# Patient Record
Sex: Male | Born: 1945 | Race: White | Hispanic: No | Marital: Married | Smoking: Former smoker
Health system: Southern US, Community
[De-identification: ages and names within clinical notes are randomized; demographics above are authoritative.]

---

## 2005-05-10 ENCOUNTER — Encounter: Admission: RE | Admit: 2005-05-10 | Discharge: 2005-05-10 | Payer: Self-pay | Admitting: Neurosurgery

## 2008-10-16 ENCOUNTER — Emergency Department (HOSPITAL_COMMUNITY): Admission: EM | Admit: 2008-10-16 | Discharge: 2008-10-16 | Payer: Self-pay | Admitting: Emergency Medicine

## 2010-09-23 ENCOUNTER — Encounter: Payer: Self-pay | Admitting: Unknown Physician Specialty

## 2010-12-18 LAB — URINALYSIS, ROUTINE W REFLEX MICROSCOPIC
Ketones, ur: NEGATIVE mg/dL
Protein, ur: NEGATIVE mg/dL
Urobilinogen, UA: 0.2 mg/dL (ref 0.0–1.0)

## 2010-12-18 LAB — POCT I-STAT, CHEM 8
Calcium, Ion: 1.13 mmol/L (ref 1.12–1.32)
Glucose, Bld: 236 mg/dL — ABNORMAL HIGH (ref 70–99)
HCT: 46 % (ref 39.0–52.0)
TCO2: 31 mmol/L (ref 0–100)

## 2010-12-18 LAB — CBC
MCHC: 35 g/dL (ref 30.0–36.0)
MCV: 91.3 fL (ref 78.0–100.0)
Platelets: 325 10*3/uL (ref 150–400)
RDW: 12 % (ref 11.5–15.5)
WBC: 11.4 10*3/uL — ABNORMAL HIGH (ref 4.0–10.5)

## 2010-12-18 LAB — DIFFERENTIAL
Basophils Absolute: 0.1 10*3/uL (ref 0.0–0.1)
Basophils Relative: 1 % (ref 0–1)
Eosinophils Absolute: 0.3 10*3/uL (ref 0.0–0.7)
Neutrophils Relative %: 63 % (ref 43–77)

## 2010-12-18 LAB — GLUCOSE, CAPILLARY: Glucose-Capillary: 264 mg/dL — ABNORMAL HIGH (ref 70–99)

## 2011-03-22 ENCOUNTER — Emergency Department (HOSPITAL_COMMUNITY): Payer: Medicare HMO

## 2011-03-22 ENCOUNTER — Inpatient Hospital Stay (HOSPITAL_COMMUNITY)
Admission: EM | Admit: 2011-03-22 | Discharge: 2011-03-24 | DRG: 872 | Disposition: A | Discharge status: Home / Self Care | Payer: Medicare HMO | Attending: Internal Medicine | Admitting: Internal Medicine

## 2011-03-22 ENCOUNTER — Inpatient Hospital Stay (HOSPITAL_COMMUNITY): Payer: Medicare HMO

## 2011-03-22 DIAGNOSIS — S3730XA Unspecified injury of urethra, initial encounter: Secondary | ICD-10-CM | POA: Diagnosis not present

## 2011-03-22 DIAGNOSIS — N179 Acute kidney failure, unspecified: Secondary | ICD-10-CM | POA: Diagnosis not present

## 2011-03-22 DIAGNOSIS — A419 Sepsis, unspecified organism: Principal | ICD-10-CM | POA: Diagnosis present

## 2011-03-22 DIAGNOSIS — E785 Hyperlipidemia, unspecified: Secondary | ICD-10-CM | POA: Diagnosis present

## 2011-03-22 DIAGNOSIS — L02419 Cutaneous abscess of limb, unspecified: Secondary | ICD-10-CM | POA: Diagnosis present

## 2011-03-22 DIAGNOSIS — L03119 Cellulitis of unspecified part of limb: Secondary | ICD-10-CM | POA: Diagnosis present

## 2011-03-22 DIAGNOSIS — I129 Hypertensive chronic kidney disease with stage 1 through stage 4 chronic kidney disease, or unspecified chronic kidney disease: Secondary | ICD-10-CM | POA: Diagnosis present

## 2011-03-22 DIAGNOSIS — R310 Gross hematuria: Secondary | ICD-10-CM | POA: Diagnosis not present

## 2011-03-22 DIAGNOSIS — D72829 Elevated white blood cell count, unspecified: Secondary | ICD-10-CM | POA: Diagnosis present

## 2011-03-22 DIAGNOSIS — Z794 Long term (current) use of insulin: Secondary | ICD-10-CM

## 2011-03-22 DIAGNOSIS — G4700 Insomnia, unspecified: Secondary | ICD-10-CM | POA: Diagnosis present

## 2011-03-22 DIAGNOSIS — S3720XA Unspecified injury of bladder, initial encounter: Secondary | ICD-10-CM | POA: Diagnosis not present

## 2011-03-22 DIAGNOSIS — X58XXXA Exposure to other specified factors, initial encounter: Secondary | ICD-10-CM | POA: Diagnosis not present

## 2011-03-22 DIAGNOSIS — R6510 Systemic inflammatory response syndrome (SIRS) of non-infectious origin without acute organ dysfunction: Secondary | ICD-10-CM | POA: Diagnosis present

## 2011-03-22 DIAGNOSIS — E119 Type 2 diabetes mellitus without complications: Secondary | ICD-10-CM | POA: Diagnosis present

## 2011-03-22 DIAGNOSIS — N189 Chronic kidney disease, unspecified: Secondary | ICD-10-CM | POA: Diagnosis present

## 2011-03-22 LAB — HEMOGLOBIN A1C: Mean Plasma Glucose: 200 mg/dL — ABNORMAL HIGH (ref <117)

## 2011-03-22 LAB — DIFFERENTIAL
Basophils Absolute: 0 K/uL (ref 0.0–0.1)
Basophils Relative: 0 % (ref 0–1)
Eosinophils Absolute: 0.1 10*3/uL (ref 0.0–0.7)
Eosinophils Relative: 1 % (ref 0–5)
Lymphocytes Relative: 16 % (ref 12–46)
Lymphs Abs: 2 K/uL (ref 0.7–4.0)
Monocytes Absolute: 1.7 K/uL — ABNORMAL HIGH (ref 0.1–1.0)
Monocytes Relative: 14 % — ABNORMAL HIGH (ref 3–12)
Neutro Abs: 8.5 K/uL — ABNORMAL HIGH (ref 1.7–7.7)
Neutrophils Relative %: 70 % (ref 43–77)

## 2011-03-22 LAB — GLUCOSE, CAPILLARY
Glucose-Capillary: 110 mg/dL — ABNORMAL HIGH (ref 70–99)
Glucose-Capillary: 323 mg/dL — ABNORMAL HIGH (ref 70–99)

## 2011-03-22 LAB — URINALYSIS, ROUTINE W REFLEX MICROSCOPIC
Bilirubin Urine: NEGATIVE
Glucose, UA: NEGATIVE mg/dL
Hgb urine dipstick: NEGATIVE
Ketones, ur: NEGATIVE mg/dL
Leukocytes, UA: NEGATIVE
Nitrite: NEGATIVE
Protein, ur: NEGATIVE mg/dL
Specific Gravity, Urine: 1.019 (ref 1.005–1.030)
Urobilinogen, UA: 0.2 mg/dL (ref 0.0–1.0)
pH: 5 (ref 5.0–8.0)

## 2011-03-22 LAB — CK TOTAL AND CKMB (NOT AT ARMC)
CK, MB: 1.7 ng/mL (ref 0.3–4.0)
Relative Index: 0.6 (ref 0.0–2.5)
Total CK: 301 U/L — ABNORMAL HIGH (ref 7–232)

## 2011-03-22 LAB — COMPREHENSIVE METABOLIC PANEL
ALT: 7 U/L (ref 0–53)
AST: 12 U/L (ref 0–37)
Albumin: 3.5 g/dL (ref 3.5–5.2)
CO2: 29 mEq/L (ref 19–32)
Calcium: 9.1 mg/dL (ref 8.4–10.5)
Creatinine, Ser: 2.32 mg/dL — ABNORMAL HIGH (ref 0.50–1.35)
GFR calc non Af Amer: 28 mL/min — ABNORMAL LOW (ref >60)
Sodium: 138 mEq/L (ref 135–145)
Total Protein: 7.4 g/dL (ref 6.0–8.3)

## 2011-03-22 LAB — CBC
HCT: 39.2 % (ref 39.0–52.0)
Hemoglobin: 13.6 g/dL (ref 13.0–17.0)
MCH: 32.5 pg (ref 26.0–34.0)
MCHC: 34.7 g/dL (ref 30.0–36.0)
MCV: 93.6 fL (ref 78.0–100.0)
Platelets: 172 10*3/uL (ref 150–400)
RBC: 4.19 MIL/uL — ABNORMAL LOW (ref 4.22–5.81)
RDW: 13.5 % (ref 11.5–15.5)
WBC: 12.2 K/uL — ABNORMAL HIGH (ref 4.0–10.5)

## 2011-03-22 LAB — BASIC METABOLIC PANEL
BUN: 35 mg/dL — ABNORMAL HIGH (ref 6–23)
Chloride: 100 mEq/L (ref 96–112)
GFR calc Af Amer: 35 mL/min — ABNORMAL LOW (ref >60)
GFR calc non Af Amer: 29 mL/min — ABNORMAL LOW (ref >60)
Potassium: 4 mEq/L (ref 3.5–5.1)

## 2011-03-22 LAB — CLOSTRIDIUM DIFFICILE BY PCR: Toxigenic C. Difficile by PCR: NEGATIVE

## 2011-03-22 LAB — COMPREHENSIVE METABOLIC PANEL WITH GFR
Alkaline Phosphatase: 64 U/L (ref 39–117)
BUN: 34 mg/dL — ABNORMAL HIGH (ref 6–23)
Chloride: 98 meq/L (ref 96–112)
GFR calc Af Amer: 34 mL/min — ABNORMAL LOW (ref >60)
Glucose, Bld: 113 mg/dL — ABNORMAL HIGH (ref 70–99)
Potassium: 4 meq/L (ref 3.5–5.1)
Total Bilirubin: 1 mg/dL (ref 0.3–1.2)

## 2011-03-22 LAB — LIPID PANEL
HDL: 40 mg/dL (ref >39)
LDL Cholesterol: 74 mg/dL (ref 0–99)

## 2011-03-22 LAB — TROPONIN I: Troponin I: <0.3 ng/mL (ref <0.30)

## 2011-03-22 LAB — TSH: TSH: 1.155 u[IU]/mL (ref 0.350–4.500)

## 2011-03-22 LAB — PROCALCITONIN: Procalcitonin: <0.1 ng/mL

## 2011-03-22 LAB — LACTIC ACID, PLASMA: Lactic Acid, Venous: 2.1 mmol/L (ref 0.5–2.2)

## 2011-03-23 LAB — GLUCOSE, CAPILLARY
Glucose-Capillary: 289 mg/dL — ABNORMAL HIGH (ref 70–99)
Glucose-Capillary: 299 mg/dL — ABNORMAL HIGH (ref 70–99)
Glucose-Capillary: 318 mg/dL — ABNORMAL HIGH (ref 70–99)
Glucose-Capillary: 340 mg/dL — ABNORMAL HIGH (ref 70–99)

## 2011-03-23 LAB — BASIC METABOLIC PANEL
BUN: 36 mg/dL — ABNORMAL HIGH (ref 6–23)
CO2: 22 mEq/L (ref 19–32)
Chloride: 101 mEq/L (ref 96–112)
Glucose, Bld: 291 mg/dL — ABNORMAL HIGH (ref 70–99)
Potassium: 4 mEq/L (ref 3.5–5.1)
Sodium: 136 mEq/L (ref 135–145)

## 2011-03-23 LAB — URINE CULTURE
Colony Count: NO GROWTH
Culture  Setup Time: 201207201016
Culture: NO GROWTH

## 2011-03-23 LAB — FECAL LACTOFERRIN, QUANT: Fecal Lactoferrin: POSITIVE

## 2011-03-23 IMAGING — US US RENAL
1 series · 14 of 17 positions shown · non-contrast
Comparison: None.

CLINICAL DATA: Hypertension and renal insufficiency.

RENAL/URINARY TRACT ULTRASOUND COMPLETE

[Series 1: us renal · 0.29mm/px · 14 of 17 slices shown]
[im 1/17]
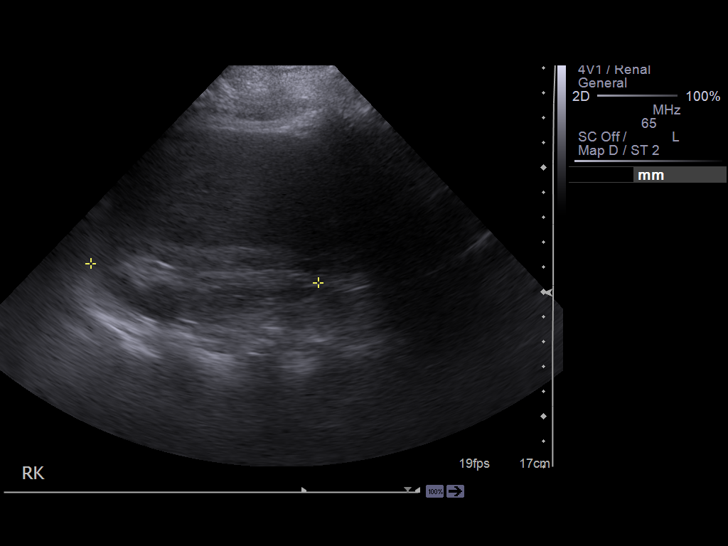
[im 2/17]
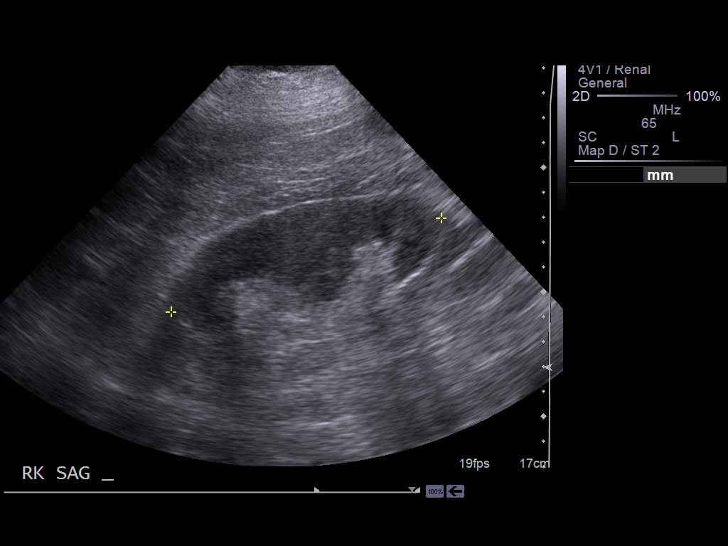
[im 4/17]
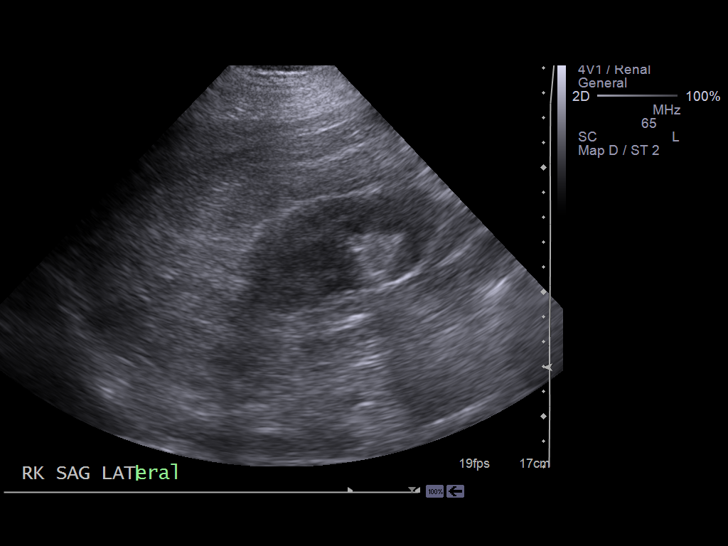
[im 5/17]
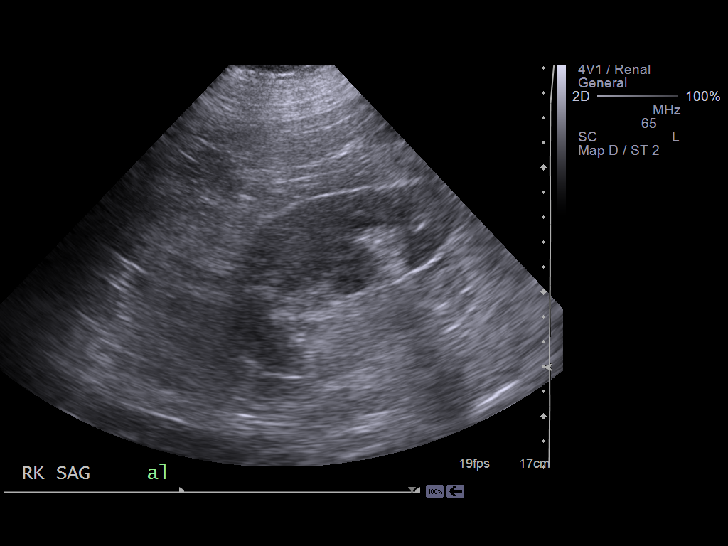
[im 6/17]
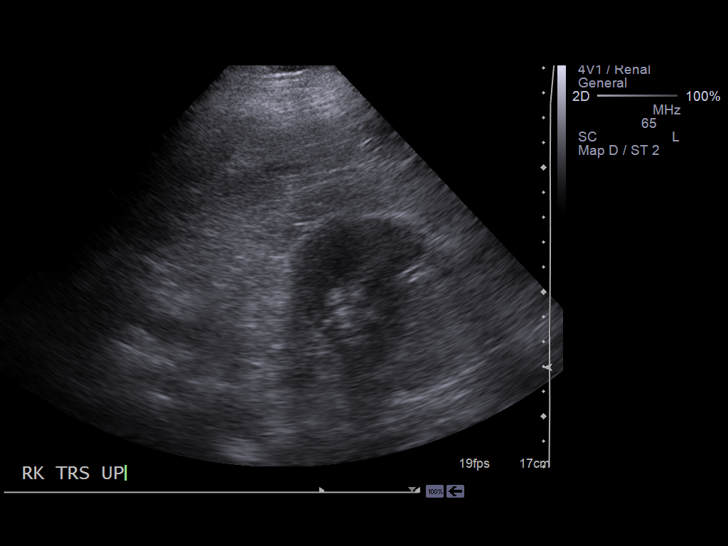
[im 7/17]
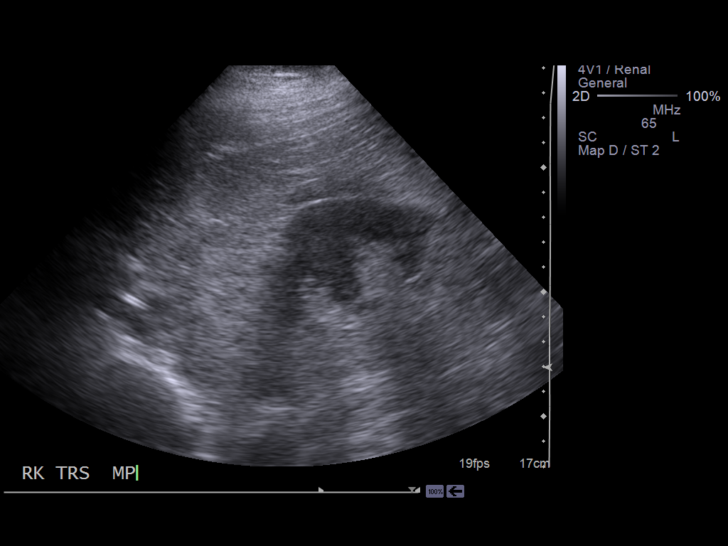
[im 8/17]
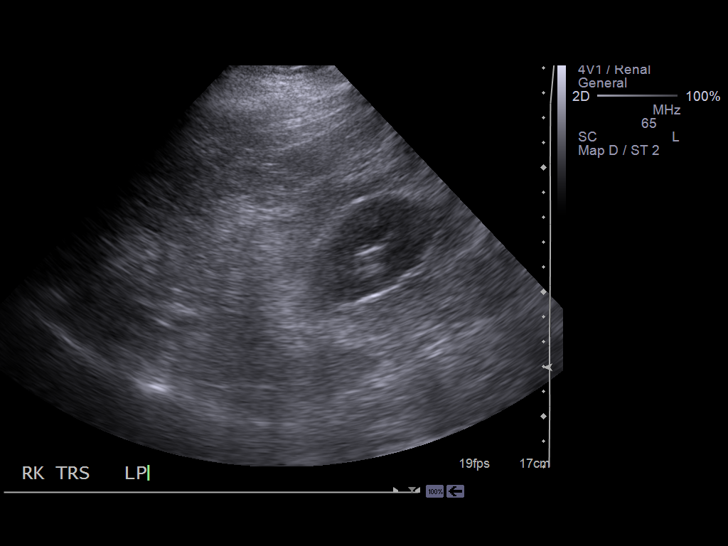
[im 10/17]
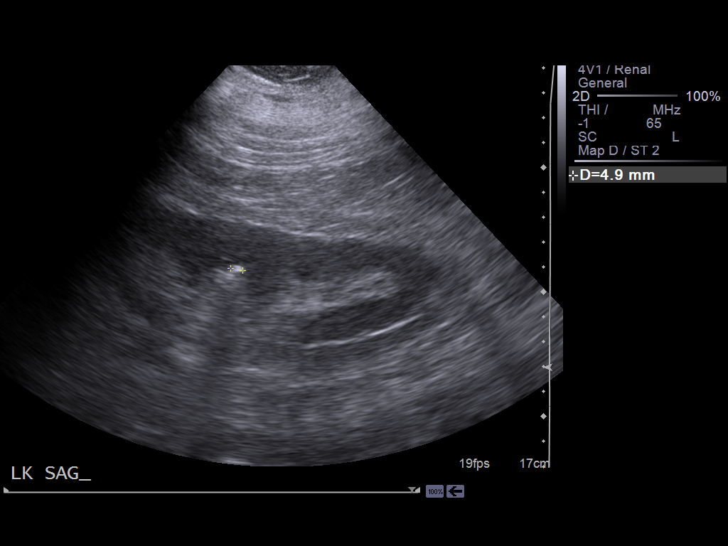
[im 11/17]
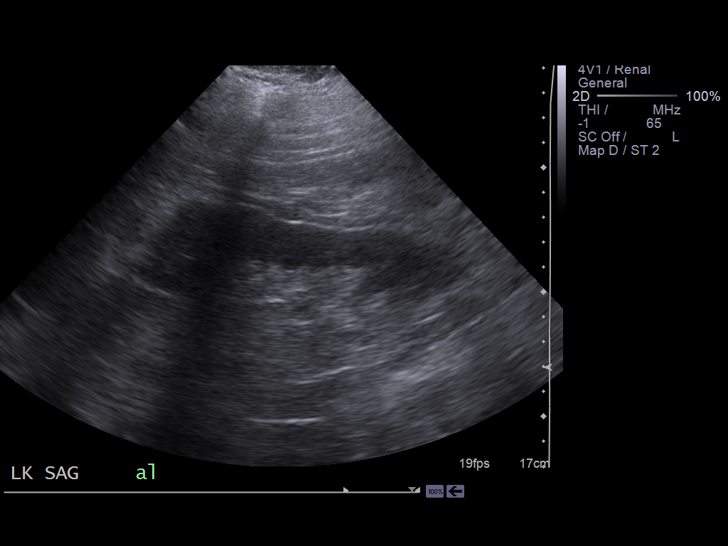
[im 12/17]
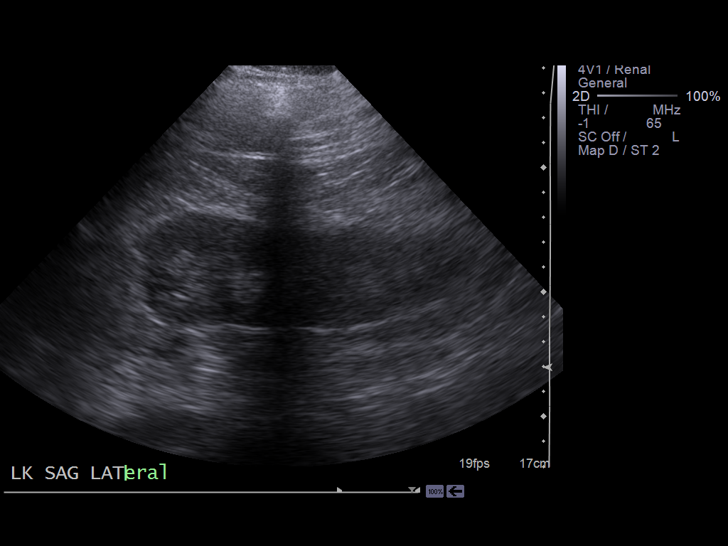
[im 13/17]
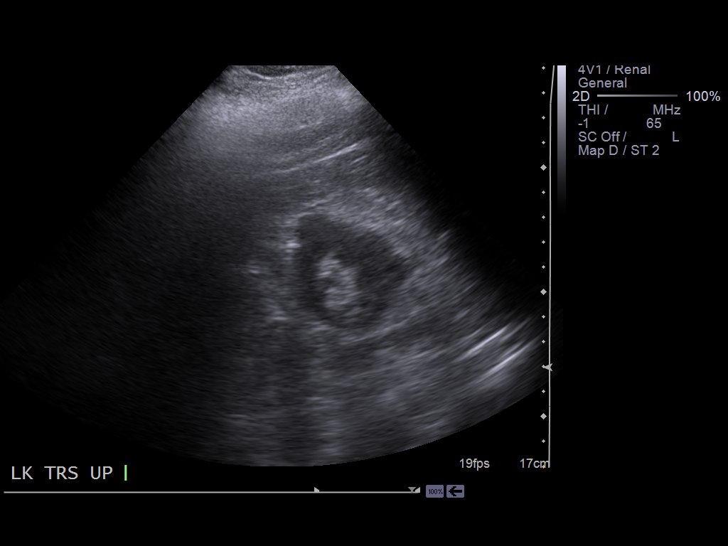
[im 14/17]
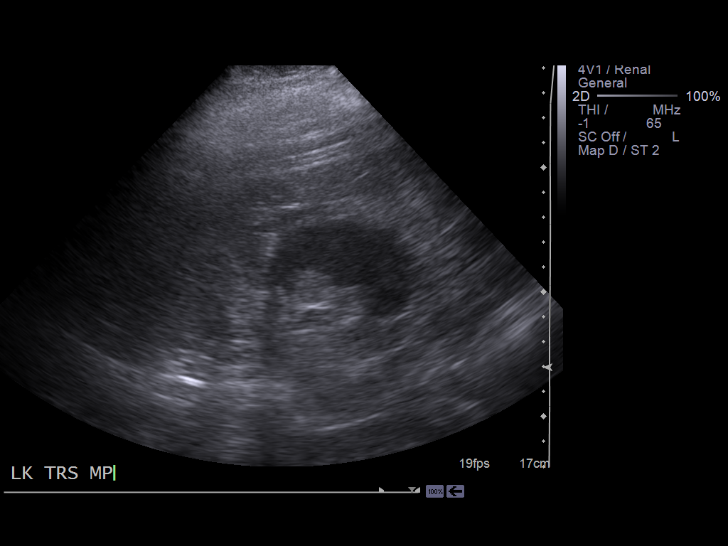
[im 16/17]
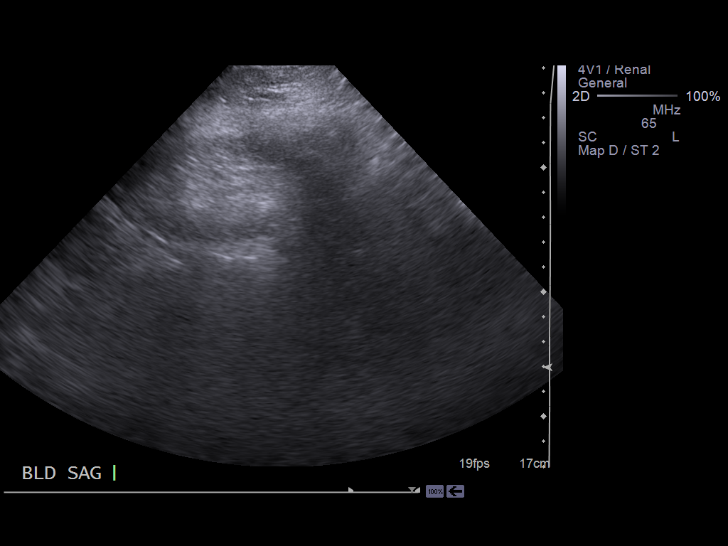
[im 17/17]
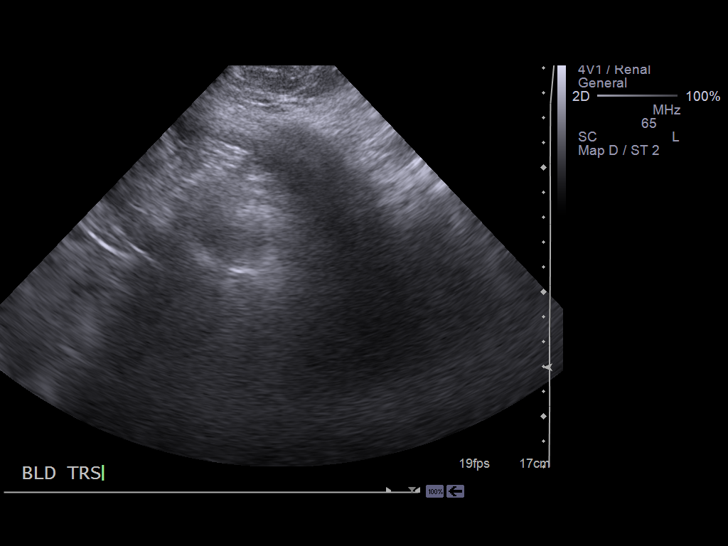

[14 of 17 positions shown; findings below may reference images not displayed]

FINDINGS: Right Kidney:  The right kidney measures approximately 11.5 cm in
length.  The kidney is unremarkable in sonographic appearance
without evidence of obstruction. There likely is partial
duplication of the collecting system.  No focal mass or shadowing
calculi identified.

Left Kidney:  The left kidney measures approximately 13.5 cm.
There is a focal shadowing calculus in the lower pole measuring
approximately 5 mm and not causing obstruction.  No evidence of
hydronephrosis, focal mass or significant atrophy.

Bladder:  The bladder is decompressed.
IMPRESSION: No evidence of renal obstruction or significant atrophy.
Nonobstructing shadowing calculus in the lower pole of the left
kidney measures approximately 5 mm by ultrasound.

## 2011-03-24 LAB — URINALYSIS, ROUTINE W REFLEX MICROSCOPIC
Nitrite: NEGATIVE
Protein, ur: 100 mg/dL — AB
Specific Gravity, Urine: 1.026 (ref 1.005–1.030)
Urobilinogen, UA: 0.2 mg/dL (ref 0.0–1.0)

## 2011-03-24 LAB — BASIC METABOLIC PANEL
BUN: 26 mg/dL — ABNORMAL HIGH (ref 6–23)
Chloride: 103 mEq/L (ref 96–112)
GFR calc Af Amer: 51 mL/min — ABNORMAL LOW (ref >60)
GFR calc non Af Amer: 42 mL/min — ABNORMAL LOW (ref >60)
Potassium: 4 mEq/L (ref 3.5–5.1)
Sodium: 138 mEq/L (ref 135–145)

## 2011-03-24 LAB — CBC
HCT: 32.9 % — ABNORMAL LOW (ref 39.0–52.0)
Hemoglobin: 10.9 g/dL — ABNORMAL LOW (ref 13.0–17.0)
MCHC: 33.1 g/dL (ref 30.0–36.0)
RDW: 13.5 % (ref 11.5–15.5)
WBC: 7.9 10*3/uL (ref 4.0–10.5)

## 2011-03-24 LAB — URINE MICROSCOPIC-ADD ON

## 2011-03-25 LAB — GIARDIA/CRYPTOSPORIDIUM SCREEN(EIA): Giardia Screen - EIA: NEGATIVE

## 2011-03-25 LAB — URINE CULTURE
Colony Count: NO GROWTH
Culture  Setup Time: 201207221341
Special Requests: NEGATIVE

## 2011-03-26 LAB — STOOL CULTURE

## 2011-03-27 NOTE — Consult Note (Signed)
NAMEIDAN, PRIME NO.:  [id number]  MEDICAL RECORD NO.:  [id number]  LOCATION:                                 FACILITY:  PHYSICIAN:  Valetta Fuller, MD    DATE OF BIRTH:  1945-11-30  DATE OF CONSULTATION:  03/22/2011 DATE OF DISCHARGE:                                CONSULTATION   REASON FOR CONSULTATION:  Gross hematuria, questionable ongoing urinary retention, bleeding per penis, and possible malpositioned Foley catheter.  HISTORY OF PRESENT ILLNESS:  Derrick Carpenter is 65 years of age.  He was admitted primarily with confusion as well as a right leg swelling and pain.  The patient went to his primary care doctor's office and had been noted to have questionable infection in his leg.  The patient subsequently was noted by his family to be markedly confused and febrile.  As part of his official assessment and stabilization, a Foley catheter was placed.  During that timeframe, the patient's confusion resulted in him pulling on the catheter.  He began experiencing substantial blood per urethra and blood in his urine.  The catheter was not able to be irrigated and we were consulted.  When we saw the patient, the Foley catheter was clearly not within the bladder and it appeared that the Foley balloon had been yanked into the prostatic urethra and the catheter was not draining.  That Foley catheter was removed with substantial blood per urethra.  We were able to replace that with a 20-French Foley and 1500 mL of tea-colored urine was obtained.  Irrigation was performed and there did not appear to be any evidence of ongoing bleeding.  At a baseline, the patient does appear to have at least moderate obstruction.  He denies any other urologic history and has not been under the care of a urologist and has not been taking any medication for BPH or voiding dysfunction.  Past medical history is notable for hypertension, diabetes mellitus, gout, and  hyperlipidemia.  The patient's admission medications include allopurinol, amitriptyline, cephalexin, Crestor, Cymbalta, Lantus insulin, promethazine, quinapril, trazodone.  He has no drug allergies.  SOCIAL HISTORY:  Negative for alcohol use.  Review of systems is positive for some leg swelling and discomfort.  Again, he has at least moderate baseline obstructive voiding symptoms.  Prior to our evaluation with him, he was complaining of extreme lower abdominal pressure and severe urge to urinate.  PHYSICAL EXAMINATION:  GENERAL:  The patient is a well-developed, well- nourished male in moderate to severe distress prior to proper Foley catheter placement. VITAL SIGNS:  The patient was afebrile with a blood pressure 135/73 and a pulse of 90. NECK:  Showed no obvious JVD. ABDOMEN:  Notable for lower abdominal distention, significant suprapubic tenderness. GU:  Penile exam showed a malpositioned Foley catheter with blood per meatus.  Unremarkable scrotum and adnexal structures.  PROCEDURE:  Again, a 20-French Foley catheter placed with irrigation.  ASSESSMENT:  Gross hematuria due to urethral trauma.  His Foley catheter should be left indwelling for an additional 48 hours and then removed if no longer clinically indicated.  Culture should be considered prior to discharge with appropriate treatment.  The patient probably should follow up with Urology as an outpatient given his baseline voiding symptoms.     Valetta Fuller, MD     DSG/MEDQ  D:  03/24/2011  T:  03/25/2011  Job:  [id number]  Electronically Signed by Barron Alvine M.D. on 03/27/2011 08:43:39 AM

## 2011-03-28 LAB — CULTURE, BLOOD (ROUTINE X 2)
Culture  Setup Time: 201207200941
Culture  Setup Time: 201207200941
Culture: NO GROWTH
Culture: NO GROWTH

## 2011-04-02 NOTE — Discharge Summary (Signed)
Derrick Carpenter, Derrick Carpenter                ACCOUNT NO.:  [id number]  MEDICAL RECORD NO.:  [id number]  LOCATION:  4702                         FACILITY:  [facility]  PHYSICIAN:  Mauro Kaufmann, MD         DATE OF BIRTH:  October 17, 1945  DATE OF ADMISSION:  03/22/2011 DATE OF DISCHARGE:  03/24/2011                              DISCHARGE SUMMARY   ADMISSION DIAGNOSES: 1. Altered mental status due to the early sepsis or systemic     inflammatory response syndrome. 2. Right leg cellulitis and diabetic foot. 3. Leukocytosis. 4. Diabetes mellitus. 5. Chronic kidney disease.  DISCHARGE DIAGNOSES: 1. Altered mental status, resolved, secondary to cellulitis. 2. Cellulitis, resolved. 3. Traumatic hematuria, resolved. 4. Diabetes mellitus. 5. Acute-on-chronic kidney disease, improved after stopping the Lasix     and giving intravenous fluids.  TESTS PERFORMED IN THE HOSPITAL STAY:  The patient had a chest x-ray on March 22, 2011, which showed cardiomegaly and no acute cardiopulmonary process. CT head without contrast on March 22, 2011, showed normal noncontrast CT of the brain for age.  Foot x-ray on March 22, 2011, showed stable and normal bone mineralization in the cuneiforms and metatarsals in 2010, query osteomyelitis versus erosive arthropathy.  No definite acute osseous abnormality in the right foot.  Renal ultrasound showed no evidence of renal obstruction, nonobstructing shadowing calculus in the lower pole of left kidney measuring about 5 mm by ultrasound.  CONSULTS OBTAINED IN THE HOSPITAL:  Urology consult for the traumatic hematuria.  BRIEF HISTORY AND PHYSICAL:  This is a 65 year old Caucasian gentleman who has been having pain and swelling and redness of the right foot four days ago, went to the [facility] where he was given IM injection and prescription for Phenergan and Keflex.  The patient was discharged home but came back because he was not feeling better.  Yesterday,  the patient was found to have intermittent confusion which got worse in 12 hours.  The patient started talking out of his head and was not making any sense, so the patient was brought to the hospital and the patient was found to have cellulitis and started on IV antibiotics with vancomycin and Zosyn.  BRIEF HOSPITAL COURSE: 1. Cellulitis.  The patient had two sets of blood cultures done on     March 22, 2011, which have been negative so far.  He had normal     procalcitonin and lactic acid.  The patient improved with     vancomycin and Zosyn.  His cellulitis has almost resolved after the     antibiotics.  At this time, the patient will be sent home on p.o.     Bactrim for seven more days to complete 10 days course of     antibiotics.  The patient's white count was elevated on the day of     admission to 12.2 and it has gone down to 7.9. 2. Hematuria.  The patient had a Foley catheter placed, and he pulled     out Foley catheter when he was confused and agitated.  After that,     he started having frank hematuria, so Urology consultation was  obtained.  The patient's Foley catheter was replaced, and Urology     has been following the patient in the hospital.  At this time, the     patient's Foley has been removed and the patient has voided well.     The patient will follow up with the Urology as outpatient.  Also,     we found that the patient on renal ultrasound has 5-mm     nonobstructing kidney stone, for which also Dr. Isabel Caprice will follow     the patient as outpatient. 3. Chronic kidney disease.  The patient when came to the hospital was     found to have creatinine of 2.27.  After we noted that the patient     was on furosemide, we stopped the furosemide and his creatinine     also has come down to 1.6.  I am going to stop the Lasix at this     time.  The patient will be sent home on Bactrim.  Bactrim can     elevate the creatinine, so he needs to have a repeat BMET done in 2      weeks' time at the primary care's office. 4. Diabetes mellitus.  The patient's blood sugars are fairly well     controlled.  At one time, the blood sugar went up more than 250.     The patient is currently on NPH 60 units b.i.d.  He should follow     up with primary care physician to adjust the medications for his     diabetes. 5. Altered mental status.  As above, the patient has altered mental     status because of the cellulitis.  I am going to hold the Ambien at     this time and continue the patient on trazodone for his insomnia.  MEDICATIONS ON DISCHARGE: 1. Bactrim DS 1 tablet p.o. b.i.d. 2. Allopurinol 300 mg 1 tablet p.o. daily. 3. Humulin NPH 60 units subcu twice a day. 4. Promethazine 25 mg 1 tablet p.o. four times a day as needed. 5. Quinapril 40 mg 1 tablet p.o. daily. 6. Trazodone 150 mg p.o. daily at bedtime.  Stop taking furosemide.  Follow up with Dr. Isabel Caprice as outpatient and also follow up with primary care physician, Dr. Tomasa Blase as outpatient.     Mauro Kaufmann, MD     GL/MEDQ  D:  03/24/2011  T:  03/25/2011  Job:  [id number]  cc:   Foye Deer, MD Valetta Fuller, MD  Electronically Signed by Mauro Kaufmann  on 04/02/2011 11:49:23 AM

## 2011-04-20 NOTE — H&P (Signed)
NAMELOUKAS, ANTONSON NO.:  [id number]  MEDICAL RECORD NO.:  [id number]  LOCATION:  [facility]                         FACILITY:  [facility]  PHYSICIAN:  Homero Fellers, MD   DATE OF BIRTH:  1946-02-23  DATE OF ADMISSION:  03/22/2011 DATE OF DISCHARGE:                             HISTORY & PHYSICAL   PRIMARY CARE PHYSICIAN:  Unassigned.  CHIEF COMPLAINT:  Confusion and right leg swelling and pain.  HISTORY OF PRESENT ILLNESS:  This is a 65 year old Caucasian gentleman who has been having pain, swelling, and redness of his right leg which started a few days ago, prompted him to go to [facility] where he was given some IM injection and given a prescription for Phenergan and Keflex.  The patient was discharged home but came back because he was not feeling better.  Yesterday, family noticed intermittent confusion which got worse in the past 12 hours when the patient started talking out of his head and was not making sense altogether.  The patient also has been having fever, and in the emergency room, his temperature was 100.6.  The patient has not had any chest pain or vomiting even though he has been nauseous.  There is no abdominal pain, cough, shortness of breath, palpitation, urinary symptoms, or back pain.  There is no history of trauma to his right leg.  There is no neck stiffness or headache.  According to the family, his confusion seemed to get worse yesterday at [facility].  There is no reported itching or rash.  PAST MEDICAL HISTORY: 1. High blood pressure. 2. Diabetes mellitus. 3. Gout. 4. Hyperlipidemia.  MEDICATIONS:  Allopurinol, amitriptyline, cephalexin, Crestor, Cymbalta, Lasix, Lantus, potassium, promethazine, quinapril, trazodone, and Zolpidem.  ALLERGIES:  None.  SOCIAL HISTORY:  No smoking, alcohol, or drugs.  FAMILY HISTORY:  Noncontributory.  REVIEW OF SYSTEMS:  Ten-point review of systems is negative except  as above.  PHYSICAL EXAMINATION:  VITAL SIGNS:  Blood pressure is 129/67, pulse 91- 106, respirations 20, temperature 100.6. GENERAL:  The patient is confused, noncooperative, trying to climb out of bed, appeared to be in respiratory distress. HEENT:  Pallor.  Mouth is moist. NECK:  Supple.  No JVD, adenopathy, or thyromegaly. LUNGS:  Clear bilaterally to auscultation.  No wheezing or crackles. HEART:  S1 and S2 regular rate and rhythm.  No murmurs, rubs, or gallops. ABDOMEN:  Full, soft, nontender.  Bowel sounds present.  No masses. EXTREMITIES:  He has mild swelling of the right foot and ankle area with redness and streaking to the distal right leg.  There is no obvious wound, breakdown, or draining ulcers. LYMPHATICS:  Normal lymph glands with no swelling. NEUROLOGIC:  Apart from confusion, no focal deficits identified.  The patient is able to tell me his name but appeared disoriented to time and partly to place. PSYCHIATRIC:  Normal affect.  The patient is agitated.  LABORATORY DATA:  White count is 12.2, no left shift, hemoglobin 13.6, platelet count 172.  Glucose 113, potassium 4, BUN 34, creatinine 2.32 which is not his baseline, albumin 3.5. Chest x-ray, cardiomegaly with no acute cardiopulmonary abnormalities. Head CT showed no acute findings. Lactic acid 2.1.  Procalcitonin 0.1.  ASSESSMENT:  This is a 65 year old man admitted with: 1. Altered mental status which is probably likely secondary to early     sepsis versus systemic inflammatory response syndrome. 2. Right leg cellulitis and diabetic foot. 3. Leukocytosis, likely secondary to above. 4. Diabetes mellitus which appeared compensated. 5. Chronic kidney disease.  PLAN:  Admit to telemetry, use IV Zosyn and vancomycin.  Send blood cultures.  Send urinalysis and urine culture.  Keep n.p.o. until fully awake.  Put on fingerstick q.6 h plus low-dose Lantus.  Home medications should be optimized.  Use Ativan and  haloperidol as needed for agitation.  Overall condition is guarded.     Homero Fellers, MD     FA/MEDQ  D:  03/22/2011  T:  03/22/2011  Job:  [id number]  Electronically Signed by Homero Fellers  on 04/20/2011 06:23:48 AM

## 2013-09-28 ENCOUNTER — Other Ambulatory Visit: Payer: Self-pay | Admitting: Orthopedic Surgery

## 2013-09-28 DIAGNOSIS — M545 Low back pain, unspecified: Secondary | ICD-10-CM

## 2013-10-07 ENCOUNTER — Ambulatory Visit
Admission: RE | Admit: 2013-10-07 | Discharge: 2013-10-07 | Disposition: A | Discharge status: Home / Self Care | Payer: Medicare HMO | Source: Ambulatory Visit | Attending: Orthopedic Surgery | Admitting: Orthopedic Surgery

## 2013-10-07 DIAGNOSIS — M545 Low back pain, unspecified: Secondary | ICD-10-CM

## 2013-11-12 ENCOUNTER — Other Ambulatory Visit: Payer: Self-pay | Admitting: Specialist

## 2013-11-12 DIAGNOSIS — M549 Dorsalgia, unspecified: Secondary | ICD-10-CM

## 2013-11-15 ENCOUNTER — Ambulatory Visit
Admission: RE | Admit: 2013-11-15 | Discharge: 2013-11-15 | Disposition: A | Discharge status: Home / Self Care | Payer: Medicare HMO | Source: Ambulatory Visit | Attending: Specialist | Admitting: Specialist

## 2013-11-15 VITALS — BP 180/73 | HR 72

## 2013-11-15 DIAGNOSIS — M549 Dorsalgia, unspecified: Secondary | ICD-10-CM

## 2013-11-15 MED ORDER — METHYLPREDNISOLONE ACETATE 40 MG/ML INJ SUSP (RADIOLOG
120.0000 mg | Freq: Once | INTRAMUSCULAR | Status: AC
  Administered 2013-11-15: 120 mg via EPIDURAL

## 2013-11-15 MED ORDER — IOHEXOL 180 MG/ML  SOLN
1.0000 mL | Freq: Once | INTRAMUSCULAR | Status: AC | PRN
  Administered 2013-11-15: 1 mL via EPIDURAL

## 2013-11-15 NOTE — Discharge Instructions (Signed)

## 2013-12-13 ENCOUNTER — Other Ambulatory Visit: Payer: Self-pay | Admitting: Specialist

## 2013-12-13 DIAGNOSIS — M4800 Spinal stenosis, site unspecified: Secondary | ICD-10-CM

## 2013-12-16 ENCOUNTER — Ambulatory Visit
Admission: RE | Admit: 2013-12-16 | Discharge: 2013-12-16 | Disposition: A | Discharge status: Home / Self Care | Payer: Commercial Managed Care - HMO | Source: Ambulatory Visit | Attending: Specialist | Admitting: Specialist

## 2013-12-16 DIAGNOSIS — M4800 Spinal stenosis, site unspecified: Secondary | ICD-10-CM

## 2013-12-16 MED ORDER — METHYLPREDNISOLONE ACETATE 40 MG/ML INJ SUSP (RADIOLOG
120.0000 mg | Freq: Once | INTRAMUSCULAR | Status: AC
  Administered 2013-12-16: 120 mg via EPIDURAL

## 2013-12-16 MED ORDER — IOHEXOL 180 MG/ML  SOLN
1.0000 mL | Freq: Once | INTRAMUSCULAR | Status: AC | PRN
  Administered 2013-12-16: 1 mL via EPIDURAL

## 2014-01-31 ENCOUNTER — Other Ambulatory Visit: Payer: Self-pay | Admitting: Specialist

## 2014-01-31 DIAGNOSIS — M549 Dorsalgia, unspecified: Secondary | ICD-10-CM

## 2014-02-01 ENCOUNTER — Ambulatory Visit
Admission: RE | Admit: 2014-02-01 | Discharge: 2014-02-01 | Disposition: A | Discharge status: Home / Self Care | Payer: Medicare HMO | Source: Ambulatory Visit | Attending: Specialist | Admitting: Specialist

## 2014-02-01 DIAGNOSIS — M549 Dorsalgia, unspecified: Secondary | ICD-10-CM

## 2014-02-01 MED ORDER — IOHEXOL 180 MG/ML  SOLN
1.0000 mL | Freq: Once | INTRAMUSCULAR | Status: AC | PRN
  Administered 2014-02-01: 1 mL via EPIDURAL

## 2014-02-01 MED ORDER — METHYLPREDNISOLONE ACETATE 40 MG/ML INJ SUSP (RADIOLOG
120.0000 mg | Freq: Once | INTRAMUSCULAR | Status: AC
  Administered 2014-02-01: 120 mg via EPIDURAL

## 2017-05-09 DIAGNOSIS — E1142 Type 2 diabetes mellitus with diabetic polyneuropathy: Secondary | ICD-10-CM | POA: Diagnosis not present

## 2017-05-09 DIAGNOSIS — E1169 Type 2 diabetes mellitus with other specified complication: Secondary | ICD-10-CM | POA: Diagnosis not present

## 2018-09-17 DIAGNOSIS — R601 Generalized edema: Secondary | ICD-10-CM | POA: Diagnosis not present

## 2018-09-17 DIAGNOSIS — J9621 Acute and chronic respiratory failure with hypoxia: Secondary | ICD-10-CM

## 2018-09-17 DIAGNOSIS — I509 Heart failure, unspecified: Secondary | ICD-10-CM

## 2018-09-17 DIAGNOSIS — E119 Type 2 diabetes mellitus without complications: Secondary | ICD-10-CM

## 2018-09-17 DIAGNOSIS — E669 Obesity, unspecified: Secondary | ICD-10-CM

## 2018-09-17 DIAGNOSIS — R0602 Shortness of breath: Secondary | ICD-10-CM

## 2018-09-17 DIAGNOSIS — N183 Chronic kidney disease, stage 3 (moderate): Secondary | ICD-10-CM

## 2018-09-17 DIAGNOSIS — M109 Gout, unspecified: Secondary | ICD-10-CM

## 2018-09-18 DIAGNOSIS — J9621 Acute and chronic respiratory failure with hypoxia: Secondary | ICD-10-CM | POA: Diagnosis not present

## 2018-09-18 DIAGNOSIS — N183 Chronic kidney disease, stage 3 (moderate): Secondary | ICD-10-CM | POA: Diagnosis not present

## 2018-09-18 DIAGNOSIS — I509 Heart failure, unspecified: Secondary | ICD-10-CM | POA: Diagnosis not present

## 2018-09-18 DIAGNOSIS — R601 Generalized edema: Secondary | ICD-10-CM | POA: Diagnosis not present

## 2018-09-19 DIAGNOSIS — J9621 Acute and chronic respiratory failure with hypoxia: Secondary | ICD-10-CM | POA: Diagnosis not present

## 2018-09-19 DIAGNOSIS — N183 Chronic kidney disease, stage 3 (moderate): Secondary | ICD-10-CM | POA: Diagnosis not present

## 2018-09-19 DIAGNOSIS — R601 Generalized edema: Secondary | ICD-10-CM | POA: Diagnosis not present

## 2018-09-19 DIAGNOSIS — I509 Heart failure, unspecified: Secondary | ICD-10-CM | POA: Diagnosis not present

## 2018-09-20 DIAGNOSIS — N183 Chronic kidney disease, stage 3 (moderate): Secondary | ICD-10-CM | POA: Diagnosis not present

## 2018-09-20 DIAGNOSIS — J9621 Acute and chronic respiratory failure with hypoxia: Secondary | ICD-10-CM | POA: Diagnosis not present

## 2018-09-20 DIAGNOSIS — R601 Generalized edema: Secondary | ICD-10-CM | POA: Diagnosis not present

## 2018-09-20 DIAGNOSIS — I509 Heart failure, unspecified: Secondary | ICD-10-CM | POA: Diagnosis not present

## 2018-09-21 DIAGNOSIS — J9621 Acute and chronic respiratory failure with hypoxia: Secondary | ICD-10-CM | POA: Diagnosis not present

## 2018-09-21 DIAGNOSIS — N183 Chronic kidney disease, stage 3 (moderate): Secondary | ICD-10-CM | POA: Diagnosis not present

## 2018-09-21 DIAGNOSIS — E119 Type 2 diabetes mellitus without complications: Secondary | ICD-10-CM | POA: Diagnosis not present

## 2018-09-21 DIAGNOSIS — I509 Heart failure, unspecified: Secondary | ICD-10-CM | POA: Diagnosis not present

## 2018-09-22 DIAGNOSIS — E119 Type 2 diabetes mellitus without complications: Secondary | ICD-10-CM | POA: Diagnosis not present

## 2018-09-22 DIAGNOSIS — J9621 Acute and chronic respiratory failure with hypoxia: Secondary | ICD-10-CM | POA: Diagnosis not present

## 2018-09-22 DIAGNOSIS — N183 Chronic kidney disease, stage 3 (moderate): Secondary | ICD-10-CM | POA: Diagnosis not present

## 2018-09-22 DIAGNOSIS — I509 Heart failure, unspecified: Secondary | ICD-10-CM | POA: Diagnosis not present

## 2018-09-23 DIAGNOSIS — I509 Heart failure, unspecified: Secondary | ICD-10-CM | POA: Diagnosis not present

## 2018-09-23 DIAGNOSIS — E119 Type 2 diabetes mellitus without complications: Secondary | ICD-10-CM | POA: Diagnosis not present

## 2018-09-23 DIAGNOSIS — N183 Chronic kidney disease, stage 3 (moderate): Secondary | ICD-10-CM | POA: Diagnosis not present

## 2018-09-23 DIAGNOSIS — J9621 Acute and chronic respiratory failure with hypoxia: Secondary | ICD-10-CM | POA: Diagnosis not present

## 2018-09-24 DIAGNOSIS — I509 Heart failure, unspecified: Secondary | ICD-10-CM | POA: Diagnosis not present

## 2018-09-24 DIAGNOSIS — E119 Type 2 diabetes mellitus without complications: Secondary | ICD-10-CM | POA: Diagnosis not present

## 2018-09-24 DIAGNOSIS — J9621 Acute and chronic respiratory failure with hypoxia: Secondary | ICD-10-CM | POA: Diagnosis not present

## 2018-09-24 DIAGNOSIS — N183 Chronic kidney disease, stage 3 (moderate): Secondary | ICD-10-CM | POA: Diagnosis not present

## 2018-09-25 DIAGNOSIS — I509 Heart failure, unspecified: Secondary | ICD-10-CM | POA: Diagnosis not present

## 2018-09-25 DIAGNOSIS — J9621 Acute and chronic respiratory failure with hypoxia: Secondary | ICD-10-CM | POA: Diagnosis not present

## 2018-09-25 DIAGNOSIS — J441 Chronic obstructive pulmonary disease with (acute) exacerbation: Secondary | ICD-10-CM

## 2018-09-25 DIAGNOSIS — J9600 Acute respiratory failure, unspecified whether with hypoxia or hypercapnia: Secondary | ICD-10-CM

## 2018-09-26 DIAGNOSIS — I509 Heart failure, unspecified: Secondary | ICD-10-CM | POA: Diagnosis not present

## 2018-09-26 DIAGNOSIS — J9600 Acute respiratory failure, unspecified whether with hypoxia or hypercapnia: Secondary | ICD-10-CM | POA: Diagnosis not present

## 2018-09-26 DIAGNOSIS — J441 Chronic obstructive pulmonary disease with (acute) exacerbation: Secondary | ICD-10-CM | POA: Diagnosis not present

## 2018-09-26 DIAGNOSIS — J9621 Acute and chronic respiratory failure with hypoxia: Secondary | ICD-10-CM | POA: Diagnosis not present

## 2018-09-27 DIAGNOSIS — J9600 Acute respiratory failure, unspecified whether with hypoxia or hypercapnia: Secondary | ICD-10-CM | POA: Diagnosis not present

## 2018-09-27 DIAGNOSIS — J9621 Acute and chronic respiratory failure with hypoxia: Secondary | ICD-10-CM | POA: Diagnosis not present

## 2018-09-27 DIAGNOSIS — I509 Heart failure, unspecified: Secondary | ICD-10-CM | POA: Diagnosis not present

## 2018-09-27 DIAGNOSIS — J441 Chronic obstructive pulmonary disease with (acute) exacerbation: Secondary | ICD-10-CM | POA: Diagnosis not present

## 2018-09-28 DIAGNOSIS — N183 Chronic kidney disease, stage 3 (moderate): Secondary | ICD-10-CM | POA: Diagnosis not present

## 2018-09-28 DIAGNOSIS — I509 Heart failure, unspecified: Secondary | ICD-10-CM | POA: Diagnosis not present

## 2018-09-28 DIAGNOSIS — E119 Type 2 diabetes mellitus without complications: Secondary | ICD-10-CM | POA: Diagnosis not present

## 2018-09-28 DIAGNOSIS — J9621 Acute and chronic respiratory failure with hypoxia: Secondary | ICD-10-CM | POA: Diagnosis not present

## 2018-09-29 DIAGNOSIS — N183 Chronic kidney disease, stage 3 (moderate): Secondary | ICD-10-CM | POA: Diagnosis not present

## 2018-09-29 DIAGNOSIS — J9621 Acute and chronic respiratory failure with hypoxia: Secondary | ICD-10-CM | POA: Diagnosis not present

## 2018-09-29 DIAGNOSIS — E119 Type 2 diabetes mellitus without complications: Secondary | ICD-10-CM | POA: Diagnosis not present

## 2018-09-29 DIAGNOSIS — I509 Heart failure, unspecified: Secondary | ICD-10-CM | POA: Diagnosis not present

## 2018-09-30 DIAGNOSIS — J9600 Acute respiratory failure, unspecified whether with hypoxia or hypercapnia: Secondary | ICD-10-CM | POA: Diagnosis not present

## 2018-09-30 DIAGNOSIS — J9621 Acute and chronic respiratory failure with hypoxia: Secondary | ICD-10-CM | POA: Diagnosis not present

## 2018-09-30 DIAGNOSIS — J441 Chronic obstructive pulmonary disease with (acute) exacerbation: Secondary | ICD-10-CM | POA: Diagnosis not present

## 2018-09-30 DIAGNOSIS — I509 Heart failure, unspecified: Secondary | ICD-10-CM | POA: Diagnosis not present

## 2018-10-06 DIAGNOSIS — I509 Heart failure, unspecified: Secondary | ICD-10-CM | POA: Diagnosis not present

## 2018-10-06 DIAGNOSIS — J9600 Acute respiratory failure, unspecified whether with hypoxia or hypercapnia: Secondary | ICD-10-CM | POA: Diagnosis not present

## 2018-10-06 DIAGNOSIS — J441 Chronic obstructive pulmonary disease with (acute) exacerbation: Secondary | ICD-10-CM | POA: Diagnosis not present

## 2018-10-06 DIAGNOSIS — J9621 Acute and chronic respiratory failure with hypoxia: Secondary | ICD-10-CM | POA: Diagnosis not present

## 2018-10-07 DIAGNOSIS — J441 Chronic obstructive pulmonary disease with (acute) exacerbation: Secondary | ICD-10-CM | POA: Diagnosis not present

## 2018-10-07 DIAGNOSIS — J9600 Acute respiratory failure, unspecified whether with hypoxia or hypercapnia: Secondary | ICD-10-CM | POA: Diagnosis not present

## 2018-10-07 DIAGNOSIS — I509 Heart failure, unspecified: Secondary | ICD-10-CM | POA: Diagnosis not present

## 2018-10-07 DIAGNOSIS — J9621 Acute and chronic respiratory failure with hypoxia: Secondary | ICD-10-CM | POA: Diagnosis not present

## 2023-01-01 DEATH — deceased
